# Patient Record
Sex: Female | Born: 1960 | Race: White | Hispanic: No
Health system: Southern US, Community
[De-identification: ages and names within clinical notes are randomized; demographics above are authoritative.]

---

## 2020-08-12 ENCOUNTER — Emergency Department (HOSPITAL_COMMUNITY): Payer: Medicare Other

## 2020-08-12 ENCOUNTER — Emergency Department (HOSPITAL_COMMUNITY)
Admission: EM | Admit: 2020-08-12 | Discharge: 2020-08-12 | Disposition: A | Discharge status: Home / Self Care | Payer: Medicare Other | Attending: Emergency Medicine | Admitting: Emergency Medicine

## 2020-08-12 ENCOUNTER — Other Ambulatory Visit: Payer: Self-pay

## 2020-08-12 DIAGNOSIS — Z79899 Other long term (current) drug therapy: Secondary | ICD-10-CM | POA: Diagnosis not present

## 2020-08-12 DIAGNOSIS — E669 Obesity, unspecified: Secondary | ICD-10-CM | POA: Diagnosis not present

## 2020-08-12 DIAGNOSIS — Z7982 Long term (current) use of aspirin: Secondary | ICD-10-CM | POA: Insufficient documentation

## 2020-08-12 DIAGNOSIS — I110 Hypertensive heart disease with heart failure: Secondary | ICD-10-CM | POA: Diagnosis not present

## 2020-08-12 DIAGNOSIS — R202 Paresthesia of skin: Secondary | ICD-10-CM | POA: Insufficient documentation

## 2020-08-12 DIAGNOSIS — I509 Heart failure, unspecified: Secondary | ICD-10-CM | POA: Diagnosis not present

## 2020-08-12 DIAGNOSIS — Z87891 Personal history of nicotine dependence: Secondary | ICD-10-CM | POA: Diagnosis not present

## 2020-08-12 LAB — URINALYSIS, ROUTINE W REFLEX MICROSCOPIC
Bilirubin Urine: NEGATIVE
Glucose, UA: NEGATIVE mg/dL
Hgb urine dipstick: NEGATIVE
Ketones, ur: NEGATIVE mg/dL
Leukocytes,Ua: NEGATIVE
Nitrite: NEGATIVE
Protein, ur: NEGATIVE mg/dL
Specific Gravity, Urine: 1.011 (ref 1.005–1.030)
pH: 5 (ref 5.0–8.0)

## 2020-08-12 LAB — COMPREHENSIVE METABOLIC PANEL
ALT: 18 U/L (ref 0–44)
AST: 18 U/L (ref 15–41)
Albumin: 3.3 g/dL — ABNORMAL LOW (ref 3.5–5.0)
Alkaline Phosphatase: 45 U/L (ref 38–126)
Anion gap: 11 (ref 5–15)
BUN: 21 mg/dL — ABNORMAL HIGH (ref 6–20)
CO2: 22 mmol/L (ref 22–32)
Calcium: 8.9 mg/dL (ref 8.9–10.3)
Chloride: 106 mmol/L (ref 98–111)
Creatinine, Ser: 0.79 mg/dL (ref 0.44–1.00)
GFR calc Af Amer: >60 mL/min (ref >60)
GFR calc non Af Amer: >60 mL/min (ref >60)
Glucose, Bld: 92 mg/dL (ref 70–99)
Potassium: 3.8 mmol/L (ref 3.5–5.1)
Sodium: 139 mmol/L (ref 135–145)
Total Bilirubin: 0.8 mg/dL (ref 0.3–1.2)
Total Protein: 6.3 g/dL — ABNORMAL LOW (ref 6.5–8.1)

## 2020-08-12 LAB — CBC
HCT: 40.4 % (ref 36.0–46.0)
Hemoglobin: 12.9 g/dL (ref 12.0–15.0)
MCH: 31.2 pg (ref 26.0–34.0)
MCHC: 31.9 g/dL (ref 30.0–36.0)
MCV: 97.6 fL (ref 80.0–100.0)
Platelets: 285 10*3/uL (ref 150–400)
RBC: 4.14 MIL/uL (ref 3.87–5.11)
RDW: 12.6 % (ref 11.5–15.5)
WBC: 7.7 10*3/uL (ref 4.0–10.5)
nRBC: 0 % (ref 0.0–0.2)

## 2020-08-12 LAB — DIFFERENTIAL
Abs Immature Granulocytes: 0.07 10*3/uL (ref 0.00–0.07)
Basophils Absolute: 0 10*3/uL (ref 0.0–0.1)
Basophils Relative: 1 %
Eosinophils Absolute: 0 10*3/uL (ref 0.0–0.5)
Eosinophils Relative: 0 %
Immature Granulocytes: 1 %
Lymphocytes Relative: 27 %
Lymphs Abs: 2.1 10*3/uL (ref 0.7–4.0)
Monocytes Absolute: 0.6 10*3/uL (ref 0.1–1.0)
Monocytes Relative: 8 %
Neutro Abs: 4.9 10*3/uL (ref 1.7–7.7)
Neutrophils Relative %: 63 %

## 2020-08-12 LAB — PROTIME-INR
INR: 1 (ref 0.8–1.2)
Prothrombin Time: 12.4 seconds (ref 11.4–15.2)

## 2020-08-12 LAB — CBG MONITORING, ED: Glucose-Capillary: 97 mg/dL (ref 70–99)

## 2020-08-12 LAB — ETHANOL: Alcohol, Ethyl (B): <10 mg/dL (ref <10)

## 2020-08-12 LAB — RAPID URINE DRUG SCREEN, HOSP PERFORMED
Amphetamines: NOT DETECTED
Barbiturates: NOT DETECTED
Benzodiazepines: NOT DETECTED
Cocaine: NOT DETECTED
Opiates: NOT DETECTED
Tetrahydrocannabinol: POSITIVE — AB

## 2020-08-12 LAB — APTT: aPTT: 27 seconds (ref 24–36)

## 2020-08-12 LAB — TROPONIN I (HIGH SENSITIVITY)
Troponin I (High Sensitivity): 2 ng/L (ref <18)
Troponin I (High Sensitivity): 3 ng/L (ref <18)

## 2020-08-12 MED ORDER — ASPIRIN EC 325 MG PO TBEC: 325.0000 mg | DELAYED_RELEASE_TABLET | Freq: Every day | ORAL | 0 refills | Status: AC

## 2020-08-12 NOTE — ED Provider Notes (Signed)
[facility] EMERGENCY DEPARTMENT Provider Note   CSN: [id number] Arrival date & time: 08/12/20  1555     History Chief Complaint  Patient presents with  . Stroke Symptoms    Robin Bates is a 59 y.o. 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  I obtained history from EMS reports that on their examination she was having mild gait disturbance and left-sided upper extremity weakness and altered sensation.  On my examination, patient reports that she woke up at 3 AM with left-sided and neck and headache symptoms.  She went back to bed and then at approximately 10 AM, while driving to her cardiologist, she noticed left-sided facial droop in her rearview mirror and acute onset "tingling" involving her left arm.  She also endorses persistent tingling on left side of face.  Patient denies any SOB or diaphoresis, but does note an unusual left-sided chest pressure sensation that began during onset of symptoms.  I examined patient at 4:15PM, greater than 6 hours since [facility] and she is in no acute distress.  She is alert and oriented x4, answering questions appropriately, and without aphasia or any obvious memory disturbance.       HPI     No past medical history on file.  There are no problems to display for this patient.   OB History   No obstetric history on file.     No family history on file.  Social History   Tobacco Use  . Smoking status: Not on file  Substance Use Topics  . Alcohol use: Not on file  . Drug use: Not on file    Home Medications Prior to Admission medications   Medication Sig Start Date End Date Taking? Authorizing Provider  Acetaminophen 500 MG capsule Take 1,500 mg by mouth every 6 (six) hours as needed for moderate pain.   Yes [provider]  alprazolam Prudy Feeler) 2 MG  tablet Take 2 mg by mouth daily as needed for anxiety.   Yes [provider]  amphetamine-dextroamphetamine (ADDERALL XR) 20 MG 24 hr capsule Take 20 mg by mouth daily.   Yes [provider]  amphetamine-dextroamphetamine (ADDERALL) 20 MG tablet Take 20 mg by mouth daily as needed (ADHD).   Yes [provider]  atorvastatin (LIPITOR) 40 MG tablet Take 40 mg by mouth at bedtime.   Yes [provider]  escitalopram (LEXAPRO) 20 MG tablet Take 20 mg by mouth at bedtime.   Yes [provider]  furosemide (LASIX) 20 MG tablet Take 20 mg by mouth daily.   Yes [provider]  gabapentin (NEURONTIN) 300 MG capsule Take 600 mg by mouth at bedtime.   Yes [provider]  omeprazole (PRILOSEC) 20 MG capsule Take 20 mg by mouth daily.   Yes [provider]  Prenatal Vit-Fe Fumarate-FA (MULTIVITAMIN-PRENATAL) 27-0.8 MG TABS tablet Take 1 tablet by mouth daily at 12 noon.   Yes [provider]  aspirin EC 325 MG tablet Take 1 tablet (325 mg total) by mouth daily. 08/12/20   Lorelee New, PA-C    Allergies    Morphine and related, Promethazine, and Vyvanse [lisdexamfetamine]  Review of Systems   Review of Systems  All other systems reviewed and are negative.   Physical Exam Updated Vital Signs BP (!) 116/59 (BP Location: Right Arm)   Pulse Marland Kitchen)  57   Temp 98.5 F (36.9 C) (Oral)   Resp 14   SpO2 99%   Physical Exam Vitals and nursing note reviewed. Exam conducted with a chaperone present.  Constitutional:      Appearance: Normal appearance. She is obese.  HENT:     Head: Normocephalic and atraumatic.     Comments: No obvious facial droop.    Mouth/Throat:     Comments: Patent oropharynx.  Uvula rises symmetrically.  Tongue rises midline. Eyes:     General: No scleral icterus.    Conjunctiva/sclera: Conjunctivae normal.     Comments: PERRL and EOM intact.  Symmetric.  No nystagmus.  Cardiovascular:     Rate  and Rhythm: Normal rate and regular rhythm.     Pulses: Normal pulses.     Heart sounds: Normal heart sounds.  Pulmonary:     Effort: Pulmonary effort is normal. No respiratory distress.     Breath sounds: Normal breath sounds.  Musculoskeletal:     Cervical back: Normal range of motion.     Right lower leg: No edema.     Left lower leg: No edema.     Comments: No obvious unilateral extremity swelling or edema.  Moves all extremities with strength intact against resistance.  Skin:    General: Skin is dry.     Capillary Refill: Capillary refill takes less than 2 seconds.  Neurological:     Mental Status: She is alert.     GCS: GCS eye subscore is 4. GCS verbal subscore is 5. GCS motor subscore is 6.     Comments: PERRL and EOM intact.  No nystagmus.  CN II through XII grossly intact.  Mild sensory deficit stating that it "feels funny" when assessing left arm.  Grip strength intact and symmetric.  Strength intact against resistance throughout.  No aphasia.  Psychiatric:        Mood and Affect: Mood normal.        Behavior: Behavior normal.        Thought Content: Thought content normal.     ED Results / Procedures / Treatments   Labs (all labs ordered are listed, but only abnormal results are displayed) Labs Reviewed  COMPREHENSIVE METABOLIC PANEL - Abnormal; Notable for the following components:      Result Value   BUN 21 (*)    Total Protein 6.3 (*)    Albumin 3.3 (*)    All other components within normal limits  RAPID URINE DRUG SCREEN, HOSP PERFORMED - Abnormal; Notable for the following components:   Tetrahydrocannabinol POSITIVE (*)    All other components within normal limits  URINALYSIS, ROUTINE W REFLEX MICROSCOPIC - Abnormal; Notable for the following components:   Color, Urine STRAW (*)    All other components within normal limits  ETHANOL  PROTIME-INR  APTT  CBC  DIFFERENTIAL  CBG MONITORING, ED  TROPONIN I (HIGH SENSITIVITY)  TROPONIN I (HIGH SENSITIVITY)     EKG EKG Interpretation  Date/Time:  Tuesday August 12 2020 16:27:24 EDT Ventricular Rate:  58 PR Interval:    QRS Duration: 101 QT Interval:  428 QTC Calculation: 421 R Axis:   83 Text Interpretation: Sinus rhythm Normal ECG Confirmed by Eber Hong ([id number]) on 08/12/2020 9:16:33 PM   Radiology CT HEAD WO CONTRAST  Result Date: 08/12/2020 CLINICAL DATA:  Neuro deficit, acute, stroke suspected. Additional history obtained from electronic MEDICAL RECORD NUMBERLeft facial droop and tingling, left arm tingling and heaviness. EXAM: CT HEAD WITHOUT CONTRAST  TECHNIQUE: Contiguous axial images were obtained from the base of the skull through the vertex without intravenous contrast. COMPARISON:  No pertinent prior exams are available for comparison. FINDINGS: Brain: Prominent beam hardening artifact arising from the skull base limits evaluation of the caudal pons. Cerebral volume is normal. There is no acute intracranial hemorrhage. No demarcated cortical infarct. No extra-axial fluid collection. No evidence of intracranial mass. No midline shift. Vascular: No hyperdense vessel. Skull: Normal. Negative for fracture or focal lesion. Sinuses/Orbits: Visualized orbits show no acute finding. Minimal ethmoid sinus mucosal thickening. No significant mastoid effusion. IMPRESSION: Prominent beam hardening artifact arising from the skull base limits evaluation of the caudal pons. No CT evidence of acute intracranial abnormality. Minimal ethmoid sinus mucous. Electronically Signed   By: Jackey LogeKyle  Golden DO   On: 08/12/2020 17:28   MR BRAIN WO CONTRAST  Result Date: 08/12/2020 CLINICAL DATA:  Acute neurologic deficit. Left facial droop with left arm tingling. EXAM: MRI HEAD WITHOUT CONTRAST MRI CERVICAL SPINE WITHOUT CONTRAST TECHNIQUE: Multiplanar, multiecho pulse sequences of the brain and surrounding structures, and cervical spine, to include the craniocervical junction and cervicothoracic junction, were obtained  without intravenous contrast. COMPARISON:  None. FINDINGS: MRI HEAD FINDINGS Brain: No acute infarct, acute hemorrhage or extra-axial collection. A few scattered foci of T2WI-hyperintensity in the white matter, nonspecific. Normal volume of CSF spaces. No chronic microhemorrhage. Normal midline structures. Vascular: Normal flow voids. Skull and upper cervical spine: Normal marrow signal. Sinuses/Orbits: Negative. Other: None. MRI CERVICAL SPINE FINDINGS Alignment: Physiologic. Vertebrae: No fracture, evidence of discitis, or bone lesion. Cord: Normal signal and morphology. Posterior Fossa, vertebral arteries, paraspinal tissues: Negative. Disc levels: C1-2: Unremarkable. C2-3: Normal disc space and facet joints. There is no spinal canal stenosis. No neural foraminal stenosis. C3-4: Bilateral uncovertebral hypertrophy. There is no spinal canal stenosis. Mild left neural foraminal stenosis. C4-5: Moderate bilateral uncovertebral hypertrophy and small disc bulge. There is no spinal canal stenosis. Moderate right and mild left neural foraminal stenosis. C5-6: Small disc bulge. There is no spinal canal stenosis. No neural foraminal stenosis. C6-7: Normal disc space and facet joints. There is no spinal canal stenosis. No neural foraminal stenosis. C7-T1: Normal disc space and facet joints. There is no spinal canal stenosis. No neural foraminal stenosis. IMPRESSION: 1. No acute intracranial abnormality. 2. Moderate right and mild left neural foraminal stenosis at C4-5. 3. No spinal canal stenosis. Electronically Signed   By: Deatra RobinsonKevin  Herman M.D.   On: 08/12/2020 20:39   MR Cervical Spine Wo Contrast  Result Date: 08/12/2020 CLINICAL DATA:  Acute neurologic deficit. Left facial droop with left arm tingling. EXAM: MRI HEAD WITHOUT CONTRAST MRI CERVICAL SPINE WITHOUT CONTRAST TECHNIQUE: Multiplanar, multiecho pulse sequences of the brain and surrounding structures, and cervical spine, to include the craniocervical junction  and cervicothoracic junction, were obtained without intravenous contrast. COMPARISON:  None. FINDINGS: MRI HEAD FINDINGS Brain: No acute infarct, acute hemorrhage or extra-axial collection. A few scattered foci of T2WI-hyperintensity in the white matter, nonspecific. Normal volume of CSF spaces. No chronic microhemorrhage. Normal midline structures. Vascular: Normal flow voids. Skull and upper cervical spine: Normal marrow signal. Sinuses/Orbits: Negative. Other: None. MRI CERVICAL SPINE FINDINGS Alignment: Physiologic. Vertebrae: No fracture, evidence of discitis, or bone lesion. Cord: Normal signal and morphology. Posterior Fossa, vertebral arteries, paraspinal tissues: Negative. Disc levels: C1-2: Unremarkable. C2-3: Normal disc space and facet joints. There is no spinal canal stenosis. No neural foraminal stenosis. C3-4: Bilateral uncovertebral hypertrophy. There is no spinal canal stenosis. Mild  left neural foraminal stenosis. C4-5: Moderate bilateral uncovertebral hypertrophy and small disc bulge. There is no spinal canal stenosis. Moderate right and mild left neural foraminal stenosis. C5-6: Small disc bulge. There is no spinal canal stenosis. No neural foraminal stenosis. C6-7: Normal disc space and facet joints. There is no spinal canal stenosis. No neural foraminal stenosis. C7-T1: Normal disc space and facet joints. There is no spinal canal stenosis. No neural foraminal stenosis. IMPRESSION: 1. No acute intracranial abnormality. 2. Moderate right and mild left neural foraminal stenosis at C4-5. 3. No spinal canal stenosis. Electronically Signed   By: Deatra Robinson M.D.   On: 08/12/2020 20:39    Procedures Procedures (including critical care time)  Medications Ordered in ED Medications - No data to display  ED Course  I have reviewed the triage vital signs and the nursing notes.  Pertinent labs & imaging results that were available during my care of the patient were reviewed by me and  considered in my medical decision making (see chart for details).  Clinical Course as of Aug 12 2140  Tue Aug 12, 2020  1828 I spoke with Dr. Amada Jupiter, neurology, who recommended that we obtain MRI of brain and cervical spine without contrast.   [GG]    Clinical Course User Index [GG] Lorelee New, PA-C   MDM Rules/Calculators/A&P                          Given patient's reported diminished sensation and questionable facial droop on the left side and EMS reports of mildly diminished strength in left upper extremity as well as mild gait disturbance, concern for right-sided MCA stroke.  Will obtain stroke work-up.  She also notes a left-sided chest pressure sensation with radiation to his left neck concerning for acute coronary syndrome, will obtain troponin and place patient on cardiac monitoring.  Labs Troponin: 2 >> 3 CBC: WNL.  No anemia or sepsis concerning for infection. CMP: No significant derangement. UA: No evidence of infection. Urine drug screen: Positive for tetrahydrocannabinol. PT/INR/APTT: WNL. Ethanol: Less than 10. CBG: WNL at 97.  EKG is personally reviewed and demonstrates normal sinus rhythm.  Imaging CT head without contrast obtained is personally reviewed and demonstrates no acute intracranial abnormalities.    Will consult neurology regarding patient's continued left-sided altered/diminished sensation and gait disturbance.  She feels as though she is listing towards the left with ambulation.  ABCD2 score is 4, conferring moderate risk for CVA.    I spoke with Dr. Amada Jupiter, neurology, who recommended that we obtain MRI of brain and cervical spine without contrast.  MRI obtained of brain and cervical spine is obtained and negative for any acute intracranial abnormality.  There is moderate right and mild left-sided neural foraminal stenosis at level C4-C5.  On subsequent evaluation, patient tells that she has been very stressed recently.  [place]  [place] and states that she has been helping her sister move given that she recently sold her home.  She suspects that perhaps her increased physical exertion and anxiety/stress is contributing to her symptoms here today.  However, she maintains that she has never felt anything quite like this before.  Cannot exclude TIA as a possible cause for patient's presentation here in the ED today.  We will discharge her with instructions to take 325 mg aspirin once daily until she is otherwise cleared by her primary care provider.  Given the degree possible TIA,  suggested that she receive continued work-up including CT angio head and neck and possible echocardiogram on outpatient basis.  She has a good relationship with her primary care provider and plans to follow-up with them as soon as possible regarding today's encounter.  I also advised her to check a B12 level.    All of the evaluation and work-up results were discussed with the patient and any family at bedside.  Patient and/or family were informed that while patient is appropriate for discharge at this time, some medical emergencies may only develop or become detectable after a period of time.  I specifically instructed patient and/or family to return to return to the ED or seek immediate medical attention for any new or worsening symptoms.  They were provided opportunity to ask any additional questions and have none at this time.  Prior to discharge patient is feeling well, agreeable with plan for discharge home.  They have expressed understanding of verbal discharge instructions as well as return precautions and are agreeable to the plan.    Final Clinical Impression(s) / ED Diagnoses Final diagnoses:  Paresthesia of left arm    Rx / DC Orders ED Discharge Orders         Ordered    aspirin EC 325 MG tablet  Daily        08/12/20 2124           Lorelee New, PA-C 08/12/20 2141    Benjiman Core, MD 08/14/20  [phone]

## 2020-08-12 NOTE — ED Triage Notes (Signed)
Pt had stroke symptoms that started 10am left face droop and tingling. Left arm tingling and heavy. Dr. Boneta Lucks at 1500. Dr. Isidore Moos called EMS. Left facial droop per ems and heaviness on left side and abnormal sensation. A&0x4. 20g left foot

## 2020-08-12 NOTE — Discharge Instructions (Addendum)
Please follow-up with your primary care provider regarding today's encounter.  While your laboratory work-up and imaging obtained here in the ED today was reassuring, cannot exclude transient ischemic attack as a possible cause for your symptoms.  I would like for you to take aspirin 325 mg once daily until you are told otherwise by your primary care provider.  I also recommend that you obtain CT angiogram of your head and neck as well as echocardiogram for further evaluation.  This can be obtained on an outpatient basis.  Perhaps you can also check a vitamin B12 level with your primary care provider.  Please return to the ED or seek immediate medical attention should you experience any new or worsening symptoms.

## 2020-08-12 NOTE — ED Notes (Signed)
Patient transported to MRI 

## 2020-08-12 NOTE — ED Notes (Signed)
Pt back from MRI 

## 2021-07-19 IMAGING — MR MR CERVICAL SPINE W/O CM
17 of 18 series · 44 of 48 positions shown · non-contrast
Comparison: None.

CLINICAL DATA: Acute neurologic deficit. Left facial droop with
left arm tingling.

EXAM:
MRI HEAD WITHOUT CONTRAST
MRI CERVICAL SPINE WITHOUT CONTRAST
TECHNIQUE: Multiplanar, multiecho pulse sequences of the brain and surrounding
structures, and cervical spine, to include the craniocervical
junction and cervicothoracic junction, were obtained without
intravenous contrast.

[Series 5: DWI · axial · 3.0mm · 0.88mm/px · z∈[-145,-2]mm · 6 of 104 slices shown (1 of 4)]
[im 1/104]
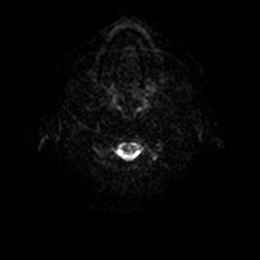
[im 21/104]
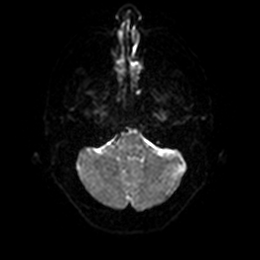
[im 42/104]
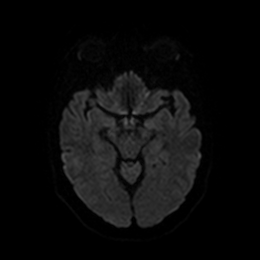
[im 62/104]
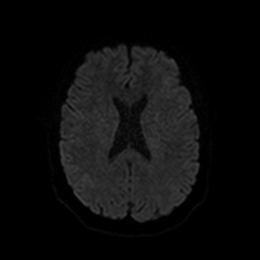
[im 83/104]
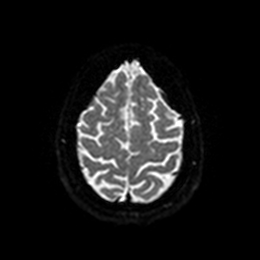
[im 104/104]
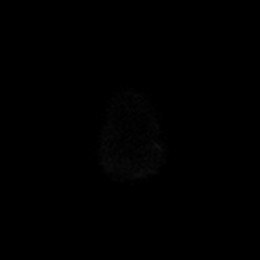

[Series 6: DWI · axial · 3.0mm · 0.88mm/px · z∈[-145,-2]mm · 2 of 52 slices shown (2 of 4)]
[im 1/52]
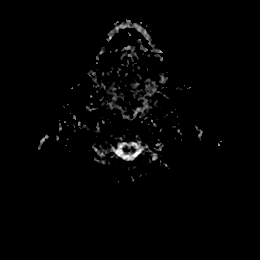
[im 52/52]
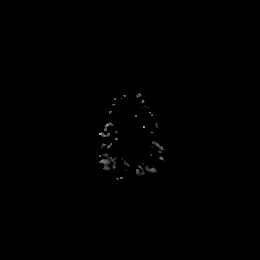

[Series 7: DWI · coronal · 4.0mm · 0.88mm/px · 4 of 76 slices shown (3 of 4)]
[im 1/76]
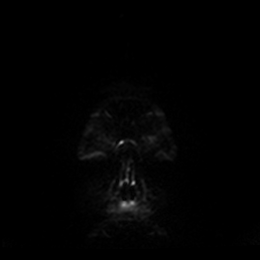
[im 26/76]
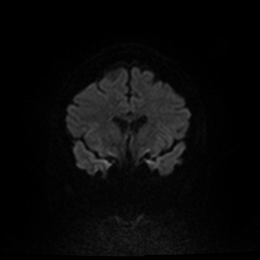
[im 51/76]
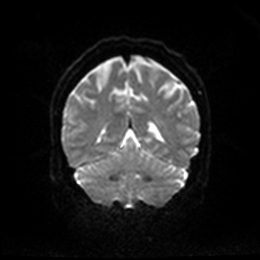
[im 76/76]
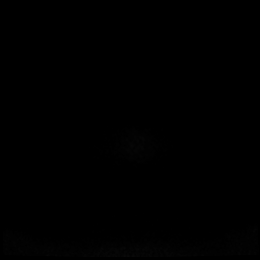

[Series 8: DWI · coronal · 4.0mm · 0.88mm/px · 2 of 37 slices shown (4 of 4)]
[im 1/37]
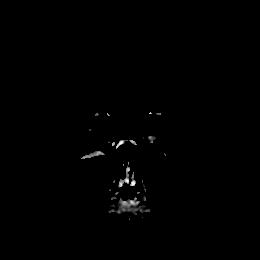
[im 37/37]
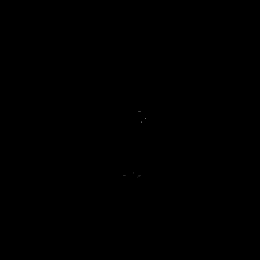

[Series 13: T1 · sagittal · 5.0mm · 0.75mm/px · 2 of 25 slices shown (1 of 2)]
[im 1/25]
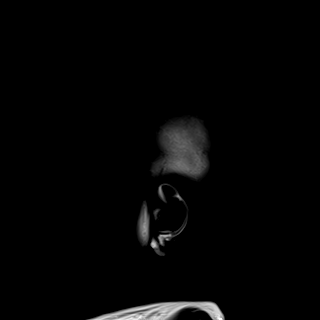
[im 25/25]
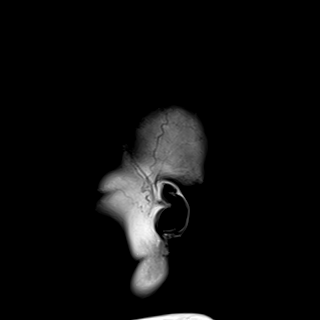

[Series 14: T2 · axial · 5.0mm · 0.72mm/px · z∈[-146,-0]mm · 2 of 27 slices shown (1 of 4)]
[im 1/27]
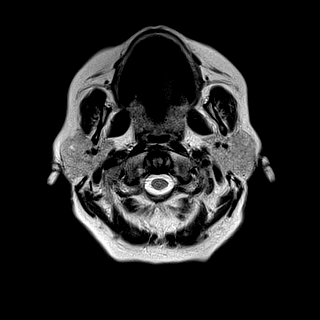
[im 27/27]
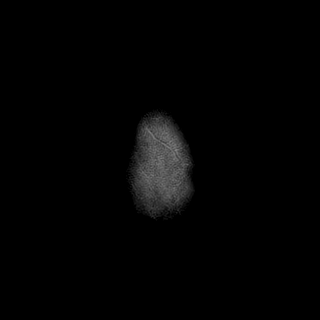

[Series 16: FLAIR · axial · 5.0mm · 0.45mm/px · z∈[-147,-1]mm · 2 of 27 slices shown]
[im 1/27]
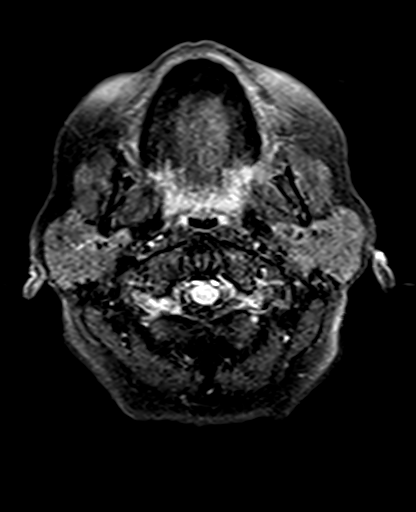
[im 27/27]
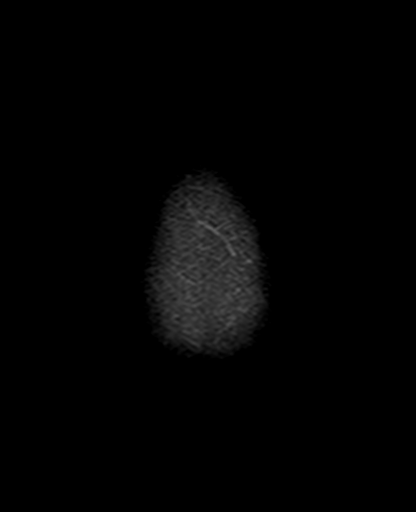

[Series 17: mag_images · axial · 3.0mm · 0.90mm/px · z∈[-153,+1]mm · 4 of 56 slices shown]
[im 1/56]
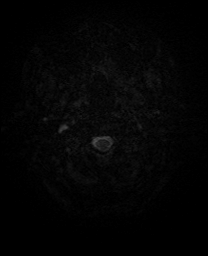
[im 19/56]
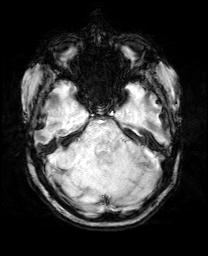
[im 37/56]
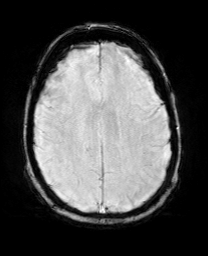
[im 56/56]
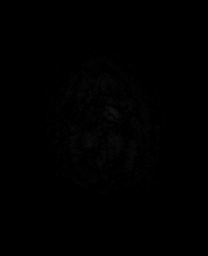

[Series 18: pha_images · axial · 3.0mm · 0.90mm/px · z∈[-153,-1]mm · 4 of 55 slices shown]
[im 1/55]
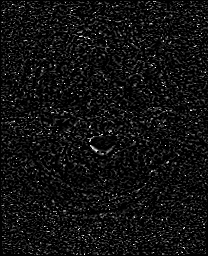
[im 19/55]
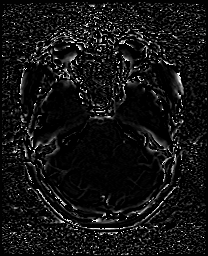
[im 37/55]
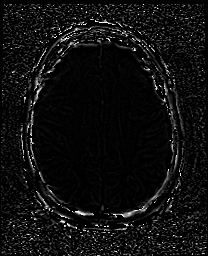
[im 55/55]
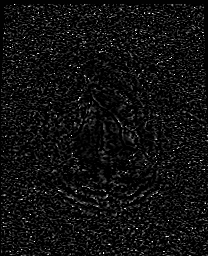

[Series 19: swi_images · axial · 3.0mm · 0.90mm/px · z∈[-153,+1]mm · 4 of 56 slices shown]
[im 1/56]
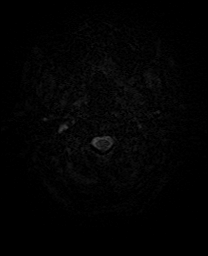
[im 19/56]
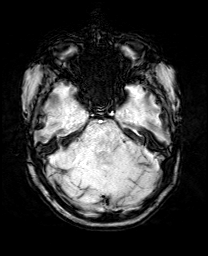
[im 37/56]
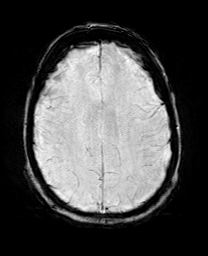
[im 56/56]
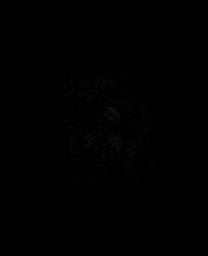

[Series 20: mip_images(sw) · axial · 24.0mm · 0.90mm/px · z∈[-143,-8]mm · 3 of 49 slices shown]
[im 1/49]
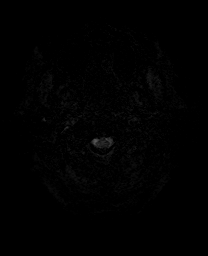
[im 25/49]
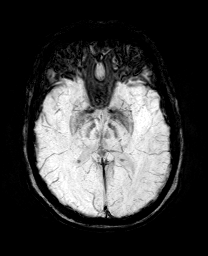
[im 49/49]
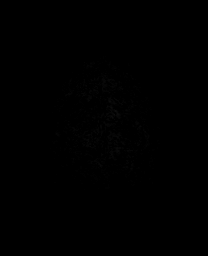

[Series 22: T2 · coronal · 5.0mm · 0.34mm/px · 2 of 29 slices shown (2 of 4)]
[im 1/29]
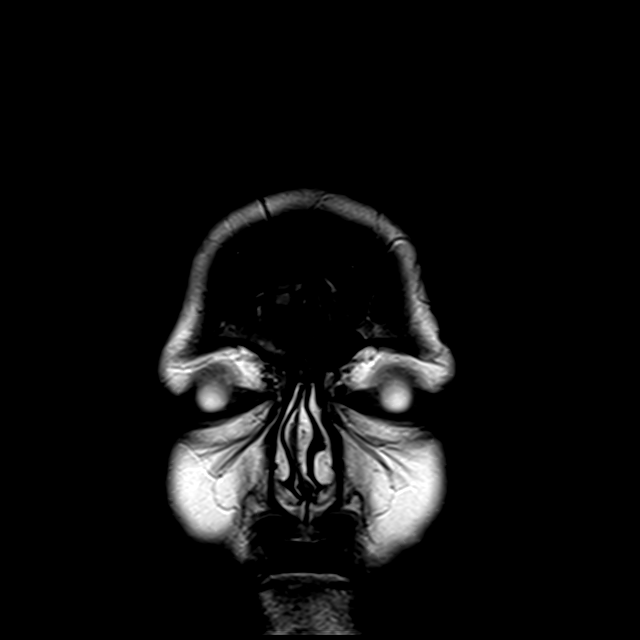
[im 29/29]
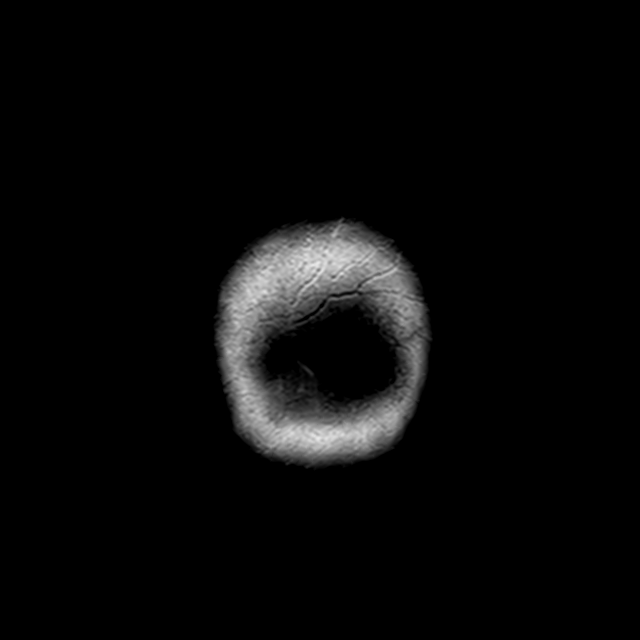

[Series 23: T1 · sagittal · 3.0mm · 0.69mm/px · 1 of 15 slices shown (2 of 2)]
[im 1/15]
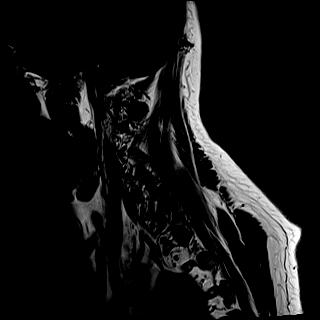

[Series 24: STIR · sagittal · 3.0mm · 0.86mm/px · 1 of 15 slices shown]
[im 1/15]
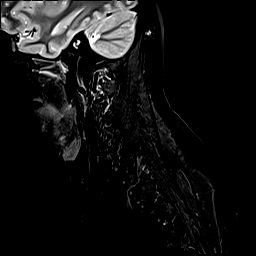

[Series 25: T2 · axial · 3.0mm · 0.66mm/px · z∈[-260,-176]mm · 2 of 30 slices shown (3 of 4)]
[im 1/30]
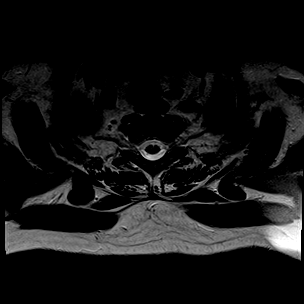
[im 30/30]
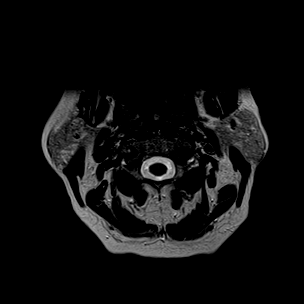

[Series 26: GRE · axial · 3.0mm · 0.39mm/px · z∈[-260,-176]mm · 2 of 30 slices shown]
[im 1/30]
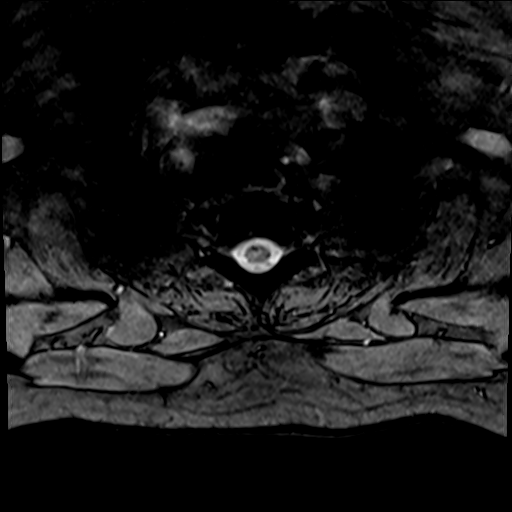
[im 30/30]
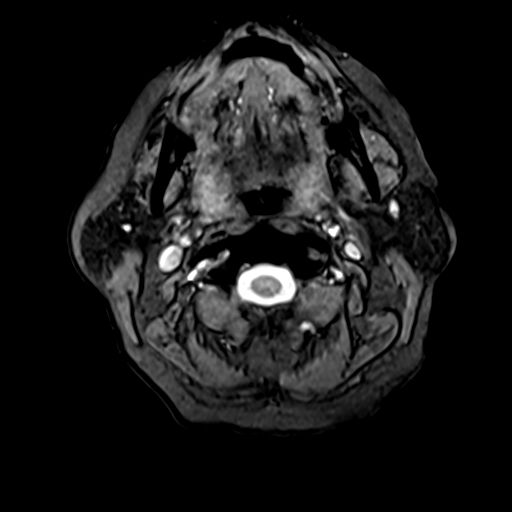

[Series 27: T2 · sagittal · 3.0mm · 0.69mm/px · 1 of 15 slices shown (4 of 4)]
[im 1/15]
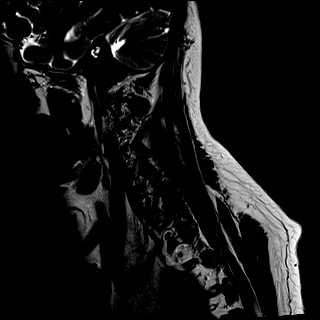

[44 of 48 positions shown; findings below may reference images not displayed]

FINDINGS: MRI HEAD FINDINGS

Brain: No acute infarct, acute hemorrhage or extra-axial collection.
A few scattered foci of XV23-hyperintensity in the white matter,
nonspecific. Normal volume of CSF spaces. No chronic
microhemorrhage. Normal midline structures.

Vascular: Normal flow voids.

Skull and upper cervical spine: Normal marrow signal.

Sinuses/Orbits: Negative.

Other: None.

MRI CERVICAL SPINE FINDINGS

Alignment: Physiologic.

Vertebrae: No fracture, evidence of discitis, or bone lesion.

Cord: Normal signal and morphology.

Posterior Fossa, vertebral arteries, paraspinal tissues: Negative.

Disc levels:

C1-2: Unremarkable.

C2-3: Normal disc space and facet joints. There is no spinal canal
stenosis. No neural foraminal stenosis.

C3-4: Bilateral uncovertebral hypertrophy. There is no spinal canal
stenosis. Mild left neural foraminal stenosis.

C4-5: Moderate bilateral uncovertebral hypertrophy and small disc
bulge. There is no spinal canal stenosis. Moderate right and mild
left neural foraminal stenosis.

C5-6: Small disc bulge. There is no spinal canal stenosis. No neural
foraminal stenosis.

C6-7: Normal disc space and facet joints. There is no spinal canal
stenosis. No neural foraminal stenosis.

C7-T1: Normal disc space and facet joints. There is no spinal canal
stenosis. No neural foraminal stenosis.
IMPRESSION: 1. No acute intracranial abnormality.
2. Moderate right and mild left neural foraminal stenosis at C4-5.
3. No spinal canal stenosis.
